# Patient Record
Sex: Female | Born: 1960 | Race: White | Hispanic: No | Marital: Married | Smoking: Never smoker
Health system: Southern US, Community
[De-identification: ages and names within clinical notes are randomized; demographics above are authoritative.]

## PROBLEM LIST (undated history)

## (undated) DIAGNOSIS — B9689 Other specified bacterial agents as the cause of diseases classified elsewhere: Secondary | ICD-10-CM

## (undated) DIAGNOSIS — K5901 Slow transit constipation: Secondary | ICD-10-CM

## (undated) DIAGNOSIS — E785 Hyperlipidemia, unspecified: Secondary | ICD-10-CM

## (undated) DIAGNOSIS — R7303 Prediabetes: Secondary | ICD-10-CM

## (undated) DIAGNOSIS — I10 Essential (primary) hypertension: Secondary | ICD-10-CM

## (undated) DIAGNOSIS — B009 Herpesviral infection, unspecified: Secondary | ICD-10-CM

## (undated) DIAGNOSIS — E039 Hypothyroidism, unspecified: Secondary | ICD-10-CM

## (undated) DIAGNOSIS — E669 Obesity, unspecified: Secondary | ICD-10-CM

## (undated) DIAGNOSIS — U071 COVID-19: Secondary | ICD-10-CM

## (undated) DIAGNOSIS — M1710 Unilateral primary osteoarthritis, unspecified knee: Secondary | ICD-10-CM

## (undated) HISTORY — PX: APPENDECTOMY: SHX54

## (undated) HISTORY — DX: COVID-19: U07.1

## (undated) HISTORY — DX: Obesity, unspecified: E66.9

## (undated) HISTORY — DX: Herpesviral infection, unspecified: B00.9

## (undated) HISTORY — DX: Hypothyroidism, unspecified: E03.9

## (undated) HISTORY — PX: TOTAL ABDOMINAL HYSTERECTOMY: SHX209

## (undated) HISTORY — DX: Hyperlipidemia, unspecified: E78.5

## (undated) HISTORY — PX: GALLBLADDER SURGERY: SHX652

## (undated) HISTORY — DX: Other specified bacterial agents as the cause of diseases classified elsewhere: B96.89

## (undated) HISTORY — DX: Prediabetes: R73.03

## (undated) HISTORY — DX: Slow transit constipation: K59.01

## (undated) HISTORY — DX: Essential (primary) hypertension: I10

## (undated) HISTORY — DX: Unilateral primary osteoarthritis, unspecified knee: M17.10

---

## 1997-09-15 ENCOUNTER — Other Ambulatory Visit: Admission: RE | Admit: 1997-09-15 | Discharge: 1997-09-15 | Payer: Self-pay | Admitting: Obstetrics & Gynecology

## 1998-10-06 ENCOUNTER — Other Ambulatory Visit: Admission: RE | Admit: 1998-10-06 | Discharge: 1998-10-06 | Payer: Self-pay | Admitting: Obstetrics & Gynecology

## 1999-03-08 ENCOUNTER — Encounter: Payer: Self-pay | Admitting: Family Medicine

## 1999-03-08 ENCOUNTER — Encounter: Admission: RE | Admit: 1999-03-08 | Discharge: 1999-03-08 | Payer: Self-pay | Admitting: Family Medicine

## 1999-05-20 ENCOUNTER — Encounter: Admission: RE | Admit: 1999-05-20 | Discharge: 1999-05-20 | Payer: Self-pay | Admitting: Family Medicine

## 1999-05-20 ENCOUNTER — Encounter: Payer: Self-pay | Admitting: Family Medicine

## 1999-06-03 ENCOUNTER — Encounter: Payer: Self-pay | Admitting: Emergency Medicine

## 1999-06-03 ENCOUNTER — Emergency Department (HOSPITAL_COMMUNITY): Admission: EM | Admit: 1999-06-03 | Discharge: 1999-06-03 | Payer: Self-pay | Admitting: Emergency Medicine

## 1999-09-15 ENCOUNTER — Encounter (INDEPENDENT_AMBULATORY_CARE_PROVIDER_SITE_OTHER): Payer: Self-pay | Admitting: Specialist

## 1999-09-15 ENCOUNTER — Inpatient Hospital Stay (HOSPITAL_COMMUNITY): Admission: RE | Admit: 1999-09-15 | Discharge: 1999-09-17 | Payer: Self-pay | Admitting: Obstetrics & Gynecology

## 2002-05-02 ENCOUNTER — Ambulatory Visit (HOSPITAL_COMMUNITY): Admission: RE | Admit: 2002-05-02 | Discharge: 2002-05-02 | Payer: Self-pay | Admitting: Endocrinology

## 2002-05-02 ENCOUNTER — Encounter: Payer: Self-pay | Admitting: Endocrinology

## 2003-12-03 ENCOUNTER — Other Ambulatory Visit: Admission: RE | Admit: 2003-12-03 | Discharge: 2003-12-03 | Payer: Self-pay | Admitting: Obstetrics & Gynecology

## 2004-05-21 ENCOUNTER — Emergency Department (HOSPITAL_COMMUNITY): Admission: EM | Admit: 2004-05-21 | Discharge: 2004-05-21 | Payer: Self-pay | Admitting: Emergency Medicine

## 2008-05-26 ENCOUNTER — Encounter: Admission: RE | Admit: 2008-05-26 | Discharge: 2008-05-26 | Payer: Self-pay | Admitting: Family Medicine

## 2008-12-27 ENCOUNTER — Encounter (INDEPENDENT_AMBULATORY_CARE_PROVIDER_SITE_OTHER): Payer: Self-pay | Admitting: General Surgery

## 2008-12-27 ENCOUNTER — Inpatient Hospital Stay (HOSPITAL_COMMUNITY): Admission: EM | Admit: 2008-12-27 | Discharge: 2008-12-29 | Payer: Self-pay | Admitting: Emergency Medicine

## 2009-02-15 ENCOUNTER — Encounter: Admission: RE | Admit: 2009-02-15 | Discharge: 2009-02-15 | Payer: Self-pay | Admitting: Family Medicine

## 2010-03-01 ENCOUNTER — Encounter
Admission: RE | Admit: 2010-03-01 | Discharge: 2010-03-01 | Payer: Self-pay | Source: Home / Self Care | Attending: Family Medicine | Admitting: Family Medicine

## 2010-05-12 ENCOUNTER — Emergency Department (HOSPITAL_COMMUNITY): Payer: 59

## 2010-05-12 ENCOUNTER — Emergency Department (HOSPITAL_COMMUNITY)
Admission: EM | Admit: 2010-05-12 | Discharge: 2010-05-12 | Disposition: A | Discharge status: Home / Self Care | Payer: 59 | Attending: Emergency Medicine | Admitting: Emergency Medicine

## 2010-05-12 DIAGNOSIS — R109 Unspecified abdominal pain: Secondary | ICD-10-CM | POA: Insufficient documentation

## 2010-05-12 DIAGNOSIS — E039 Hypothyroidism, unspecified: Secondary | ICD-10-CM | POA: Insufficient documentation

## 2010-05-12 DIAGNOSIS — N200 Calculus of kidney: Secondary | ICD-10-CM | POA: Insufficient documentation

## 2010-05-12 LAB — URINALYSIS, ROUTINE W REFLEX MICROSCOPIC

## 2010-05-12 LAB — URINE MICROSCOPIC-ADD ON

## 2010-05-13 LAB — URINE CULTURE: Culture  Setup Time: 201203090135

## 2010-06-09 LAB — DIFFERENTIAL
Basophils Absolute: 0.1 10*3/uL (ref 0.0–0.1)
Basophils Relative: 1 % (ref 0–1)
Eosinophils Absolute: 0 10*3/uL (ref 0.0–0.7)
Lymphocytes Relative: 10 % — ABNORMAL LOW (ref 12–46)

## 2010-06-09 LAB — CBC
HCT: 44.5 % (ref 36.0–46.0)
Hemoglobin: 15.3 g/dL — ABNORMAL HIGH (ref 12.0–15.0)
MCHC: 34.3 g/dL (ref 30.0–36.0)
RDW: 12.7 % (ref 11.5–15.5)
WBC: 13.9 10*3/uL — ABNORMAL HIGH (ref 4.0–10.5)

## 2010-06-09 LAB — COMPREHENSIVE METABOLIC PANEL
ALT: 33 U/L (ref 0–35)
AST: 26 U/L (ref 0–37)
CO2: 24 mEq/L (ref 19–32)
Calcium: 10.1 mg/dL (ref 8.4–10.5)
Creatinine, Ser: 0.75 mg/dL (ref 0.4–1.2)
GFR calc Af Amer: >60 mL/min (ref >60)
GFR calc non Af Amer: >60 mL/min (ref >60)
Glucose, Bld: 121 mg/dL — ABNORMAL HIGH (ref 70–99)
Sodium: 135 mEq/L (ref 135–145)

## 2010-07-22 NOTE — H&P (Signed)
[facility]  Patient:    Cynthia Hutchinson, Cynthia Hutchinson                         MRN: [id number] Attending:  Gerrit Friends. Aldona Bar, M.D.                         History and Physical  OFFICE NUMBER:  [id number].  HISTORY:  This patient is a 50 year old female, admitted for total abdominal hysterectomy and bilateral salpingo-oophorectomy, with a preoperative diagnosis of suspected adhesions, possible endometriosis, possible adenomyosis.  This patient is a gravida 5, now para 2, status post two cesarean sections, the last of which also included a tubal sterilization procedure.  She essentially has had problems with increasing discomfort, mostly in the left lower quadrant, for the past six months to a year.  She has a very significant history in that on three separate occasions, she has undergone laparoscopy for recurrent adhesions; the last time this was carried out was prior to her 1998 delivery.  As mentioned, she is status post two cesarean sections.  She has had one ectopic pregnancy and two spontaneous miscarriages.  She has also had a previous appendectomy and in the distant past, has had three procedures done on her cervix for recurrent dysplasia -- (two laser procedures and one freezing procedure).  Her current episode of discomfort essentially began in the spring of 2001.  She came to the office on several occasions with some lower back discomfort.  This was very similar in nature to the discomfort that she had prior to her previous laparoscopies, at which time, as mentioned above, adhesiolysis was carried out with improvement of her symptoms.  She actually underwent an MRI of her back, which was negative (ordered by orthopedic surgeon), and returned for followup with essentially no improvement of her back discomfort in May and again in June of 2001.  In June, she was able to relate this discomfort somewhat to her cycles and described the discomfort as burning, pulling and  tugging and the discomfort has not been relieved by any types of analgesia the patient has tried.  She was having, when seen in late June, regular cyclic menses, although prior to May, her cycles were somewhat irregular.  Because of her history of recurrent adhesions, her history of previous dysplasia of the cervix with three procedures to try to correct that problem, and the patients current symptomatology, decision was made after a long discussion with the patient to proceed with total abdominal hysterectomy and bilateral salpingo-oophorectomy. Indeed, at the time of the patients cesarean section in 1998, it was noted that she did have a large number of adhesions surrounding the right ovary. Her right fallopian tube at that time was surgically absent secondary to a previous ectopic pregnancy with surgical treatment.  As mentioned, she did undergo a tubal sterilization procedure on her left fallopian tube at the time of that cesarean section.  The patients recent cervical cytology has been within normal limits.  She did, in addition to the MRI of her back, have a pelvic ultrasound in May of 2001 with really no remarkable findings noted.  Uterus was not really enlarged and no free fluid was noted in the cul-de-sac.  A recent mammogram was likewise within normal limits.  In summary, this patient is now being admitted for total abdominal hysterectomy and bilateral salpingo-oophorectomy with estrogen-replacement therapy postoperatively, with a preoperative diagnosis of recurrent discomfort secondary to likely  recurrent adhesions, possible endometriosis, possible adenomyosis, with a distant past history of dysplasia of the cervix.  ALLERGIES:  The patient is allergic to DEMEROL.  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  PAST MEDICAL HISTORY:  The patient has had no serious  illnesses.  OPERATIONS:  As above.  FAMILY HISTORY:  Essentially noncontributory.  PAST MEDICAL HISTORY:  Negative with the exception of above.  Patient also has a diagnosis of fibromyalgia, which seems to be fairly stable at this point.  SOCIAL HISTORY:  Essentially negative.  REVIEW OF SYSTEMS:  Negative with the exception of above.  PHYSICAL EXAMINATION:  GENERAL:  Examination at the time of admission finds a well-developed female in no obvious acute distress.  VITAL SIGNS:  Weight 180-1/2 pounds.  Blood pressure 100/60.  Height 5 feet 9 inches.  Respirations 18 and regular.  Pulse 82 and regular.  Temperature 98.2.  HEENT:  Negative.  NECK:  Thyroid not enlarged.  CHEST:  Clear to auscultation and percussion.  CARDIOVASCULAR:  Regular rhythm.  No murmur.  BREASTS:  Negative.  ABDOMEN:  There is a well-healed Pfannenstiel scar.  Abdomen is essentially nontender.  Bowel sounds are normal.  PELVIC:  Examination finds the uterus normal size, anterior.  Adnexal area is unremarkable.  Rectovaginal exam confirmatory.  Cervix:  No gross lesions.  NEUROLOGIC:  Physiologic.  EXTREMITIES:  Essentially negative.  IMPRESSION:  Pelvic pain, likely secondary to recurrent adhesions -- possible endometriosis, possible adenomyosis.  PLAN:  Total abdominal hysterectomy and bilateral salpingo-oophorectomy with estrogen-replacement therapy postoperatively. DD:  09/12/99 TD:  09/13/99 Job: [id number]

## 2010-07-22 NOTE — Op Note (Signed)
[facility]  Patient:    Cynthia Hutchinson, Cynthia Hutchinson                       MRN: [id number] Proc. Date: 09/15/99 Adm. Date:  [id number] Disc. Date: [id number] Attending:  Ether Griffins, Richard Mark Iv                           Operative Report  PREOPERATIVE DIAGNOSIS:  Lower abdominal pain, suspected pelvic adhesions, possible endometriosis, possible adenomyosis, status post two cesarean sections and status post three laparoscopies.  POSTOPERATIVE DIAGNOSIS:  Lower abdominal pain, suspected pelvic adhesions, possible endometriosis, possible adenomyosis, status post two cesarean sections and status post three laparoscopies.  Pathology pending.  OPERATION PERFORMED:  Exploratory laparotomy, lysis of adhesions, total abdominal hysterectomy, bilateral salpingo-oophorectomy.  SURGEON:  Gerrit Friends. Aldona Bar, M.D.  ASSISTANT:  Luvenia Redden, M.D.  ANESTHESIA:  General endotracheal.  DESCRIPTION OF PROCEDURE:  The patient was taken to the operating room where after the satisfactory induction of general endotracheal anesthesia, she was prepped and draped and then placed in the lithotomy position.  As part of the prep a Foley catheter was placed.  Once the patient was adequately draped, the procedure was begun.  A Pfannenstiel incision was made dissecting down sharply to and through the old scar--creating hemostasis--down to the fascia which likewise was incised in a low transverse fashion.  A subfascial space was created inferiorly and superiorly, muscles separated in the midline, the peritoneum identified and entered appropriately with care taken to avoid the bowel superiorly and the bladder inferiorly.  At this time, the abdomen was explored.  There were somewhat significant adhesions between the right colon and the right adnexal area as well as between the uterus and the bladder flap.  The left adnexal area was fairly free of adhesions.  The uterus itself appeared to be  upper limits of normal size.  No upper abnormalities were noted.  At this time the self-retaining OSullivan-OConnor retractor was placed and with minimal difficulty thereafter, the bowel was packed off appropriately. At this time, lysis of adhesions was carried out freeing up the lower uterine segment from the bladder flap as well as freeing up the right adnexal area from the right colon.  At this time hysterectomy was begun in the usual fashion.  Using two long Kelly clamps, the corners of the uterus were elevated.  Beginning with the right round ligament, it was transected, rendered hemostatic with 0 Vicryl suture and the bladder flap was created anteriorly to the midline.  At this time after the right ureter was identified, the right infundibulopelvic pedicle was clamped, cut and doubly suture secured with 0 Vicryl suture, essentially removing the entire right adnexa which previously, as mentioned, had been freed up the right colon.  At this time attention was turned to the left round ligament, which, in a similar fashion to the right round ligament was transected and suture secured.  The bladder flap was created thereafter again to the midline and bladder pushed off the lower uterine segment with some adhesions encountered but essentially that portion of the procedure went well.  The left infundibulopelvic pedicle was then isolated and after the left ureter was identified, the pedicle was clamped and doubly suture secured with 0 Vicryl suture.  Attention at this time was turned to the lower uterine segment and anterior bladder flap and the bladder was pushed off the lower uterine segment with ease  and thereafter after some skeletonization of the uterine artery pedicles, the uterine artery pedicles were clamped, cut and suture secured with 0 Vicryl suture. Additional parametrial bites were taken using straight Heaney clamps and suture ligature of 0 Vicryl suture and this was carried  down to the vaginal angle which was likewise clamped, cut and suture secured on the right side and thereafter using the Satinsky scissors, the specimen essentially was removed including the cervix.  The vaginal cuff was then closed first dealing with the vaginal angles and a hemostatic suture of 0 Vicryl placed appropriately.  The vaginal cuff was then closed using figure-of-eight 0 Vicryl suture in an interrupted fashion.  At this time the pelvis was profusely irrigated and rendered hemostatic.  No additional adhesions were noted.  Once the pelvis was hemostatic, the procedure was felt to be complete.  The patient had had a previous appendectomy.  At this time packs were removed.  Retractor was removed.  Abdomen was again inspected and with all counts being correct and no foreign bodies remaining in the abdominal cavity, closure of the abdomen was begun at this time.  The abdominal peritoneum was reapproximated with 0 Vicryl in a running fashion and muscle subsequently secured with same.  Assured of good subfascial hemostasis, the fascia was then reapproximated with 0 Vicryl from angle to midline bilaterally.  Subcutaneous tissue was then rendered hemostatic and scar tissue in the subcutaneous tissue was freed up to aid in good closure of the skin which was subsequently closed with staples and thereafter a pressure dressing was applied and the patient at this time was transported to the recovery room in satisfactory condition having tolerated the procedure well.  Estimated blood loss was 250 cc.  All counts correct x 2. Specimens include the uterus, tubes and ovaries.  In summary, this patient with recurrent pelvic adhesions who was symptomatic as a result, underwent a total abdominal hysterectomy and bilateral salpingo-oophorectomy and lysis of adhesions.  She was taken to the recovery room in good condition having tolerated the procedure well.  All counts were noted to be correct x  2. DD:  09/15/99 TD:  09/15/99 Job: [id number]

## 2010-07-22 NOTE — Discharge Summary (Signed)
[facility]  Patient:    Cynthia Hutchinson, Cynthia Hutchinson                       MRN: [id number] Adm. Date:  [id number] Disc. Date: [id number] Attending:  Mickle Mallory                           Discharge Summary  DISCHARGE DIAGNOSIS:  Pelvic/abdominal adhesions - chronic pelvic pain, suspected adenomyosis and endometriosis, final pathology report pending.  PROCEDURE:  Total abdominal hysterectomy, bilateral salpingo-oophorectomy and lysis of adhesions.  SUMMARY:  This 50 year old female who has had three laparoscopies and two cesarean sections was admitted after a normal preoperative workup for a total abdominal hysterectomy and bilateral salpingo-oophorectomy with a preoperative diagnosis of suspected adhesions and possible endometriosis and adenomyosis. She underwent a TAH/BSO on July 12 without incident.  There were multiple adhesions encountered, especially involving the right ovary and ascending colon.  Procedure went well.  Her postoperative course was benign.  She remained afebrile during her entire postoperative course and her postoperative hemoglobin was 10.6 with a white count of 16,100 and a platelet count of 262,000.  On the morning of July 14, she was ambulating well, tolerating a regular diet well, having normal bowel and bladder function.  She was afebrile.  Her wound was clean and dry.  She was deemed ready for discharge. Accordingly, she was given all appropriate instructions at the time of discharge and understood all instructions well.  She will return to the office in three days for staple removal and steri-stripping of her wound.  DISCHARGE MEDICATIONS: 1. Motrin 600 mg q.6h. as needed for pain. 2. Tylox one to two q.4-6h. as needed for severe pain. 3. Vivelle dot 0.5 mg for estrogen replacement.  She was started on estrogen    replacement postoperatively.  Her pathology report is still pending at this time.  CONDITION ON DISCHARGE:   Improved. DD:  09/17/99 TD:  09/17/99 Job: [id number]

## 2012-09-18 ENCOUNTER — Other Ambulatory Visit: Payer: Self-pay | Admitting: Family Medicine

## 2012-09-18 DIAGNOSIS — M545 Low back pain: Secondary | ICD-10-CM

## 2012-09-25 ENCOUNTER — Ambulatory Visit
Admission: RE | Admit: 2012-09-25 | Discharge: 2012-09-25 | Disposition: A | Discharge status: Home / Self Care | Payer: 59 | Source: Ambulatory Visit | Attending: Family Medicine | Admitting: Family Medicine

## 2012-09-25 DIAGNOSIS — M545 Low back pain: Secondary | ICD-10-CM

## 2014-07-20 ENCOUNTER — Encounter (HOSPITAL_COMMUNITY): Payer: Self-pay | Admitting: Emergency Medicine

## 2014-07-20 ENCOUNTER — Emergency Department (HOSPITAL_COMMUNITY)
Admission: EM | Admit: 2014-07-20 | Discharge: 2014-07-20 | Disposition: A | Discharge status: Home / Self Care | Payer: 59 | Attending: Emergency Medicine | Admitting: Emergency Medicine

## 2014-07-20 ENCOUNTER — Emergency Department (HOSPITAL_COMMUNITY): Payer: 59

## 2014-07-20 DIAGNOSIS — R1032 Left lower quadrant pain: Secondary | ICD-10-CM | POA: Diagnosis present

## 2014-07-20 DIAGNOSIS — Z7982 Long term (current) use of aspirin: Secondary | ICD-10-CM | POA: Insufficient documentation

## 2014-07-20 DIAGNOSIS — Z9889 Other specified postprocedural states: Secondary | ICD-10-CM | POA: Insufficient documentation

## 2014-07-20 DIAGNOSIS — Z79899 Other long term (current) drug therapy: Secondary | ICD-10-CM | POA: Diagnosis not present

## 2014-07-20 DIAGNOSIS — N200 Calculus of kidney: Secondary | ICD-10-CM | POA: Diagnosis not present

## 2014-07-20 DIAGNOSIS — R109 Unspecified abdominal pain: Secondary | ICD-10-CM

## 2014-07-20 LAB — URINE MICROSCOPIC-ADD ON

## 2014-07-20 LAB — I-STAT CHEM 8, ED
BUN: 18 mg/dL (ref 6–20)
CALCIUM ION: 1.2 mmol/L (ref 1.12–1.23)
Chloride: 102 mmol/L (ref 101–111)
Creatinine, Ser: 0.7 mg/dL (ref 0.44–1.00)
GLUCOSE: 108 mg/dL — AB (ref 65–99)
HEMATOCRIT: 44 % (ref 36.0–46.0)
HEMOGLOBIN: 15 g/dL (ref 12.0–15.0)
POTASSIUM: 3.6 mmol/L (ref 3.5–5.1)
SODIUM: 140 mmol/L (ref 135–145)
TCO2: 22 mmol/L (ref 0–100)

## 2014-07-20 LAB — CBC
HCT: 41.8 % (ref 36.0–46.0)
HEMOGLOBIN: 13.8 g/dL (ref 12.0–15.0)
MCH: 29.9 pg (ref 26.0–34.0)
MCHC: 33 g/dL (ref 30.0–36.0)
MCV: 90.5 fL (ref 78.0–100.0)
Platelets: 265 10*3/uL (ref 150–400)
RBC: 4.62 MIL/uL (ref 3.87–5.11)
RDW: 12.9 % (ref 11.5–15.5)
WBC: 9.4 10*3/uL (ref 4.0–10.5)

## 2014-07-20 LAB — URINALYSIS, ROUTINE W REFLEX MICROSCOPIC
BILIRUBIN URINE: NEGATIVE
GLUCOSE, UA: NEGATIVE mg/dL
Ketones, ur: NEGATIVE mg/dL
Nitrite: NEGATIVE
PROTEIN: NEGATIVE mg/dL
Specific Gravity, Urine: 1.028 (ref 1.005–1.030)
Urobilinogen, UA: 0.2 mg/dL (ref 0.0–1.0)
pH: 6 (ref 5.0–8.0)

## 2014-07-20 MED ORDER — KETOROLAC TROMETHAMINE 30 MG/ML IJ SOLN
30.0000 mg | Freq: Once | INTRAMUSCULAR | Status: AC
  Administered 2014-07-20: 30 mg via INTRAVENOUS
  Filled 2014-07-20: qty 1

## 2014-07-20 MED ORDER — OXYCODONE-ACETAMINOPHEN 5-325 MG PO TABS: 1.0000 | ORAL_TABLET | ORAL | Status: DC | PRN

## 2014-07-20 MED ORDER — IBUPROFEN 800 MG PO TABS: 800.0000 mg | ORAL_TABLET | Freq: Three times a day (TID) | ORAL | Status: AC

## 2014-07-20 MED ORDER — PROMETHAZINE HCL 25 MG PO TABS: 25.0000 mg | ORAL_TABLET | Freq: Four times a day (QID) | ORAL | Status: DC | PRN

## 2014-07-20 MED ORDER — TAMSULOSIN HCL 0.4 MG PO CAPS: 0.4000 mg | ORAL_CAPSULE | Freq: Two times a day (BID) | ORAL | Status: DC

## 2014-07-20 MED ORDER — HYDROMORPHONE HCL 1 MG/ML IJ SOLN
1.0000 mg | Freq: Once | INTRAMUSCULAR | Status: AC
  Administered 2014-07-20: 1 mg via INTRAVENOUS
  Filled 2014-07-20: qty 1

## 2014-07-20 NOTE — ED Notes (Signed)
Returned from CT.

## 2014-07-20 NOTE — ED Notes (Signed)
MD at bedside. 

## 2014-07-20 NOTE — ED Provider Notes (Signed)
CSN: [id number]     Arrival date & time 07/20/14  1806 History   First MD Initiated Contact with Patient 07/20/14 1830     Chief Complaint  Patient presents with  . Flank Pain     (Consider location/radiation/quality/duration/timing/severity/associated sxs/prior Treatment) HPI Comments: The patient is a 54 year old female. She has a history of a kidney stone many years ago, she has a history of a cholecystectomy, appendectomy and a cesarean section as well as a total abdominal hysterectomy. She states she awoke this morning with left-sided flank and left lower quadrant pain, some difficulty with urination and pink colored urine. This has been persistent throughout the day of the intensity varies and fluctuates, associated nausea and diaphoresis at times, denies fevers.  Patient is a 54 y.o. female presenting with flank pain. The history is provided by the patient.  Flank Pain    History reviewed. No pertinent past medical history. Past Surgical History  Procedure Laterality Date  . Cesarean section    . Appendectomy    . Gallbladder surgery    . Total abdominal hysterectomy     No family history on file. History  Substance Use Topics  . Smoking status: Never Smoker   . Smokeless tobacco: Not on file  . Alcohol Use: Yes   OB History    No data available     Review of Systems  Genitourinary: Positive for flank pain.      Allergies  Demerol  Home Medications   Prior to Admission medications   Medication Sig Start Date End Date Taking? Authorizing Provider  acyclovir (ZOVIRAX) 400 MG tablet Take 400 mg by mouth at bedtime.  04/27/14  Yes Historical Provider, MD  aspirin 81 MG tablet Take 81 mg by mouth at bedtime.   Yes Historical Provider, MD  CRESTOR 10 MG tablet Take 10 mg by mouth 2 (two) times a week. Take on Mon & Fri. 07/09/14  Yes Historical Provider, MD  levothyroxine (SYNTHROID, LEVOTHROID) 50 MCG tablet Take 50 mcg by mouth daily before breakfast.  07/09/14  Yes  Historical Provider, MD  meloxicam (MOBIC) 15 MG tablet Take 15 mg by mouth at bedtime.  07/10/14  Yes Historical Provider, MD  vitamin E 400 UNIT capsule Take 400 Units by mouth 2 (two) times a week. Take on Mon & Fri.   Yes Historical Provider, MD  Zinc 50 MG TABS Take 1 tablet by mouth 2 (two) times a week. Take on Mon & Fri.   Yes Historical Provider, MD  ibuprofen (ADVIL,MOTRIN) 800 MG tablet Take 1 tablet (800 mg total) by mouth 3 (three) times daily. 07/20/14   Eber HongBrian Keighan Amezcua, MD  oxyCODONE-acetaminophen (PERCOCET) 5-325 MG per tablet Take 1 tablet by mouth every 4 (four) hours as needed. 07/20/14   Eber HongBrian Kashayla Ungerer, MD  promethazine (PHENERGAN) 25 MG tablet Take 1 tablet (25 mg total) by mouth every 6 (six) hours as needed for nausea or vomiting. 07/20/14   Eber HongBrian Anita Laguna, MD  tamsulosin (FLOMAX) 0.4 MG CAPS capsule Take 1 capsule (0.4 mg total) by mouth 2 (two) times daily. 07/20/14   Eber HongBrian Burch Marchuk, MD   BP 161/91 mmHg  Pulse 71  Temp(Src) 97.6 F (36.4 C) (Oral)  SpO2 100% Physical Exam  Constitutional: She appears well-developed and well-nourished. She appears distressed.  HENT:  Head: Normocephalic and atraumatic.  Mouth/Throat: Oropharynx is clear and moist. No oropharyngeal exudate.  Eyes: Conjunctivae and EOM are normal. Pupils are equal, round, and reactive to light. Right eye exhibits no  discharge. Left eye exhibits no discharge. No scleral icterus.  Neck: Normal range of motion. Neck supple. No JVD present. No thyromegaly present.  Cardiovascular: Normal rate, regular rhythm, normal heart sounds and intact distal pulses.  Exam reveals no gallop and no friction rub.   No murmur heard. Pulmonary/Chest: Effort normal and breath sounds normal. No respiratory distress. She has no wheezes. She has no rales.  Abdominal: Soft. Bowel sounds are normal. She exhibits no distension and no mass. There is tenderness (minimal left lower quadrant tenderness, left CVA tenderness is present).   Musculoskeletal: Normal range of motion. She exhibits no edema or tenderness.  Lymphadenopathy:    She has no cervical adenopathy.  Neurological: She is alert. Coordination normal.  Skin: Skin is warm and dry. No rash noted. No erythema.  Psychiatric: She has a normal mood and affect. Her behavior is normal.  Nursing note and vitals reviewed.   ED Course  Procedures (including critical care time) Labs Review Labs Reviewed  URINALYSIS, ROUTINE W REFLEX MICROSCOPIC - Abnormal; Notable for the following:    APPearance CLOUDY (*)    Hgb urine dipstick LARGE (*)    Leukocytes, UA SMALL (*)    All other components within normal limits  I-STAT CHEM 8, ED - Abnormal; Notable for the following:    Glucose, Bld 108 (*)    All other components within normal limits  CBC  URINE MICROSCOPIC-ADD ON    Imaging Review Ct Abdomen Pelvis Wo Contrast  07/20/2014   CLINICAL DATA:  54 year old female is with left flank pain, nausea, vomiting and diarrhea.  EXAM: CT ABDOMEN AND PELVIS WITHOUT CONTRAST  TECHNIQUE: Multidetector CT imaging of the abdomen and pelvis was performed following the standard protocol without IV contrast.  COMPARISON:  Prior CT abdomen/ pelvis 05/12/2010  FINDINGS: Lower Chest: Right middle lobe pulmonary nodule stable dating back to December of 2010 and therefore benign. Mild dependent atelectasis in the lower lobes. Visualized cardiac structures within normal limits for size. No pericardial effusion. Unremarkable visualized thoracic esophagus.  Abdomen: Unenhanced CT was performed per clinician order. Lack of IV contrast limits sensitivity and specificity, especially for evaluation of abdominal/pelvic solid viscera. Within these limitations, unremarkable CT appearance of the stomach, duodenum, spleen, adrenal glands and pancreas. New normal hepatic contour and morphology. No discrete hepatic lesion. The gallbladder is surgically absent. No evidence of biliary ductal dilatation.  Small  2 mm partially obstructing stone at the left ureterovesicular junction. There is mild hydroureteronephrosis and renal edema but no significant perinephric stranding. No additional nephrolithiasis identified in either kidney.  No focal bowel wall thickening or evidence of obstruction. No free fluid or suspicious adenopathy.  Pelvis: Surgical changes of prior hysterectomy. The bladder is unremarkable.  Bones/Soft Tissues: No acute fracture or aggressive appearing lytic or blastic osseous lesion. Degenerative disc disease at L4-L5 and L5-S1.  Vascular: Limited evaluation in the absence of intravenous contrast. Minimal atherosclerotic vascular calcifications.  IMPRESSION: 1. Partially obstructing 2 mm stone at the left UVJ with associated mild hydronephrosis and renal edema. 2. Additional ancillary findings as above without significant interval change.   Electronically Signed   By: Malachy MoanHeath  McCullough M.D.   On: 07/20/2014 20:19     EKG Interpretation None      MDM   Final diagnoses:  Flank pain  Kidney stone on left side    The patient has vital signs with mild hypertension, she is otherwise consistent with a kidney stone as is evidenced by a bedside ultrasound  showing mild hydronephrosis on the left. CT scan ordered to evaluate size of the kidney stone, urinalysis, kidney function. Pain medications as below.  Emergency Focused Ultrasound Exam Limited retroperitoneal ultrasound of kidneys  Performed and interpreted by Dr. Hyacinth Meeker Indication: flank pain Focused abdominal ultrasound with both kidneys imaged in transverse and longitudinal planes in real-time. Interpretation: Left sided hydronephrosis visualized.   Images archived electronically   CT confirms distal stone Renal function preserved UA without infection Pt informed Given copy of results, Stable for d/c.  Meds given in ED:  Medications  ketorolac (TORADOL) 30 MG/ML injection 30 mg (30 mg Intravenous Given 07/20/14 1929)   HYDROmorphone (DILAUDID) injection 1 mg (1 mg Intravenous Given 07/20/14 1930)    New Prescriptions   IBUPROFEN (ADVIL,MOTRIN) 800 MG TABLET    Take 1 tablet (800 mg total) by mouth 3 (three) times daily.   OXYCODONE-ACETAMINOPHEN (PERCOCET) 5-325 MG PER TABLET    Take 1 tablet by mouth every 4 (four) hours as needed.   PROMETHAZINE (PHENERGAN) 25 MG TABLET    Take 1 tablet (25 mg total) by mouth every 6 (six) hours as needed for nausea or vomiting.   TAMSULOSIN (FLOMAX) 0.4 MG CAPS CAPSULE    Take 1 capsule (0.4 mg total) by mouth 2 (two) times daily.        Eber Hong, MD 07/20/14 2034

## 2014-07-20 NOTE — ED Notes (Signed)
Pt states that she feels as if she is having a kidney stone. L flank pain, scant urine output. Alert and oriented. Hx of same 6 years ago.

## 2014-07-20 NOTE — ED Notes (Signed)
Bed: WA17 Expected date:  Expected time:  Means of arrival:  Comments: 

## 2016-04-26 DIAGNOSIS — E039 Hypothyroidism, unspecified: Secondary | ICD-10-CM | POA: Diagnosis not present

## 2016-04-26 DIAGNOSIS — E782 Mixed hyperlipidemia: Secondary | ICD-10-CM | POA: Diagnosis not present

## 2016-04-28 DIAGNOSIS — E782 Mixed hyperlipidemia: Secondary | ICD-10-CM | POA: Diagnosis not present

## 2016-04-28 DIAGNOSIS — R7301 Impaired fasting glucose: Secondary | ICD-10-CM | POA: Diagnosis not present

## 2016-04-28 DIAGNOSIS — E039 Hypothyroidism, unspecified: Secondary | ICD-10-CM | POA: Diagnosis not present

## 2016-06-01 DIAGNOSIS — B338 Other specified viral diseases: Secondary | ICD-10-CM | POA: Diagnosis not present

## 2016-06-01 DIAGNOSIS — J028 Acute pharyngitis due to other specified organisms: Secondary | ICD-10-CM | POA: Diagnosis not present

## 2016-09-18 DIAGNOSIS — M25562 Pain in left knee: Secondary | ICD-10-CM | POA: Diagnosis not present

## 2016-09-28 DIAGNOSIS — M19071 Primary osteoarthritis, right ankle and foot: Secondary | ICD-10-CM | POA: Diagnosis not present

## 2016-09-28 DIAGNOSIS — M19072 Primary osteoarthritis, left ankle and foot: Secondary | ICD-10-CM | POA: Diagnosis not present

## 2016-10-23 DIAGNOSIS — E039 Hypothyroidism, unspecified: Secondary | ICD-10-CM | POA: Diagnosis not present

## 2016-10-23 DIAGNOSIS — E782 Mixed hyperlipidemia: Secondary | ICD-10-CM | POA: Diagnosis not present

## 2016-10-27 DIAGNOSIS — E039 Hypothyroidism, unspecified: Secondary | ICD-10-CM | POA: Diagnosis not present

## 2016-10-27 DIAGNOSIS — M1712 Unilateral primary osteoarthritis, left knee: Secondary | ICD-10-CM | POA: Diagnosis not present

## 2016-10-27 DIAGNOSIS — E782 Mixed hyperlipidemia: Secondary | ICD-10-CM | POA: Diagnosis not present

## 2016-11-14 DIAGNOSIS — Z01419 Encounter for gynecological examination (general) (routine) without abnormal findings: Secondary | ICD-10-CM | POA: Diagnosis not present

## 2016-11-14 DIAGNOSIS — Z1231 Encounter for screening mammogram for malignant neoplasm of breast: Secondary | ICD-10-CM | POA: Diagnosis not present

## 2016-11-27 DIAGNOSIS — M1712 Unilateral primary osteoarthritis, left knee: Secondary | ICD-10-CM | POA: Diagnosis not present

## 2016-12-04 DIAGNOSIS — M1712 Unilateral primary osteoarthritis, left knee: Secondary | ICD-10-CM | POA: Diagnosis not present

## 2016-12-08 DIAGNOSIS — Z1211 Encounter for screening for malignant neoplasm of colon: Secondary | ICD-10-CM | POA: Diagnosis not present

## 2016-12-08 DIAGNOSIS — Z8371 Family history of colonic polyps: Secondary | ICD-10-CM | POA: Diagnosis not present

## 2016-12-08 DIAGNOSIS — K5730 Diverticulosis of large intestine without perforation or abscess without bleeding: Secondary | ICD-10-CM | POA: Diagnosis not present

## 2016-12-11 DIAGNOSIS — Z23 Encounter for immunization: Secondary | ICD-10-CM | POA: Diagnosis not present

## 2016-12-20 DIAGNOSIS — M1711 Unilateral primary osteoarthritis, right knee: Secondary | ICD-10-CM | POA: Diagnosis not present

## 2016-12-20 DIAGNOSIS — M94261 Chondromalacia, right knee: Secondary | ICD-10-CM | POA: Diagnosis not present

## 2016-12-20 DIAGNOSIS — M25562 Pain in left knee: Secondary | ICD-10-CM | POA: Diagnosis not present

## 2016-12-20 DIAGNOSIS — M1712 Unilateral primary osteoarthritis, left knee: Secondary | ICD-10-CM | POA: Diagnosis not present

## 2017-02-22 DIAGNOSIS — G8918 Other acute postprocedural pain: Secondary | ICD-10-CM | POA: Diagnosis not present

## 2017-02-22 DIAGNOSIS — M1712 Unilateral primary osteoarthritis, left knee: Secondary | ICD-10-CM | POA: Diagnosis not present

## 2017-03-02 DIAGNOSIS — G8918 Other acute postprocedural pain: Secondary | ICD-10-CM | POA: Diagnosis not present

## 2017-03-02 DIAGNOSIS — M25562 Pain in left knee: Secondary | ICD-10-CM | POA: Diagnosis not present

## 2017-03-02 DIAGNOSIS — L249 Irritant contact dermatitis, unspecified cause: Secondary | ICD-10-CM | POA: Diagnosis not present

## 2017-03-02 DIAGNOSIS — K5901 Slow transit constipation: Secondary | ICD-10-CM | POA: Diagnosis not present

## 2017-03-02 DIAGNOSIS — R399 Unspecified symptoms and signs involving the genitourinary system: Secondary | ICD-10-CM | POA: Diagnosis not present

## 2017-03-05 DIAGNOSIS — M25562 Pain in left knee: Secondary | ICD-10-CM | POA: Diagnosis not present

## 2017-03-05 DIAGNOSIS — G8918 Other acute postprocedural pain: Secondary | ICD-10-CM | POA: Diagnosis not present

## 2017-03-09 DIAGNOSIS — M25562 Pain in left knee: Secondary | ICD-10-CM | POA: Diagnosis not present

## 2017-03-09 DIAGNOSIS — G8918 Other acute postprocedural pain: Secondary | ICD-10-CM | POA: Diagnosis not present

## 2017-03-12 DIAGNOSIS — M25562 Pain in left knee: Secondary | ICD-10-CM | POA: Diagnosis not present

## 2017-03-12 DIAGNOSIS — G8918 Other acute postprocedural pain: Secondary | ICD-10-CM | POA: Diagnosis not present

## 2017-03-15 DIAGNOSIS — M25562 Pain in left knee: Secondary | ICD-10-CM | POA: Diagnosis not present

## 2017-03-15 DIAGNOSIS — G8918 Other acute postprocedural pain: Secondary | ICD-10-CM | POA: Diagnosis not present

## 2017-03-19 DIAGNOSIS — G8918 Other acute postprocedural pain: Secondary | ICD-10-CM | POA: Diagnosis not present

## 2017-03-22 DIAGNOSIS — M25562 Pain in left knee: Secondary | ICD-10-CM | POA: Diagnosis not present

## 2017-03-22 DIAGNOSIS — G8918 Other acute postprocedural pain: Secondary | ICD-10-CM | POA: Diagnosis not present

## 2017-03-28 DIAGNOSIS — M25562 Pain in left knee: Secondary | ICD-10-CM | POA: Diagnosis not present

## 2017-03-28 DIAGNOSIS — G8918 Other acute postprocedural pain: Secondary | ICD-10-CM | POA: Diagnosis not present

## 2017-03-29 DIAGNOSIS — R319 Hematuria, unspecified: Secondary | ICD-10-CM | POA: Diagnosis not present

## 2017-04-02 DIAGNOSIS — G8918 Other acute postprocedural pain: Secondary | ICD-10-CM | POA: Diagnosis not present

## 2017-04-06 DIAGNOSIS — Z96652 Presence of left artificial knee joint: Secondary | ICD-10-CM | POA: Diagnosis not present

## 2017-04-06 DIAGNOSIS — Z471 Aftercare following joint replacement surgery: Secondary | ICD-10-CM | POA: Diagnosis not present

## 2017-04-24 DIAGNOSIS — E039 Hypothyroidism, unspecified: Secondary | ICD-10-CM | POA: Diagnosis not present

## 2017-04-30 DIAGNOSIS — E782 Mixed hyperlipidemia: Secondary | ICD-10-CM | POA: Diagnosis not present

## 2017-04-30 DIAGNOSIS — E039 Hypothyroidism, unspecified: Secondary | ICD-10-CM | POA: Diagnosis not present

## 2017-04-30 DIAGNOSIS — R7301 Impaired fasting glucose: Secondary | ICD-10-CM | POA: Diagnosis not present

## 2017-06-07 DIAGNOSIS — I10 Essential (primary) hypertension: Secondary | ICD-10-CM | POA: Diagnosis not present

## 2017-07-04 DIAGNOSIS — M19071 Primary osteoarthritis, right ankle and foot: Secondary | ICD-10-CM | POA: Diagnosis not present

## 2017-07-04 DIAGNOSIS — M19072 Primary osteoarthritis, left ankle and foot: Secondary | ICD-10-CM | POA: Diagnosis not present

## 2017-07-04 DIAGNOSIS — M79672 Pain in left foot: Secondary | ICD-10-CM | POA: Diagnosis not present

## 2017-07-04 DIAGNOSIS — M79671 Pain in right foot: Secondary | ICD-10-CM | POA: Diagnosis not present

## 2017-10-29 DIAGNOSIS — E039 Hypothyroidism, unspecified: Secondary | ICD-10-CM | POA: Diagnosis not present

## 2017-10-29 DIAGNOSIS — E782 Mixed hyperlipidemia: Secondary | ICD-10-CM | POA: Diagnosis not present

## 2017-11-01 DIAGNOSIS — E782 Mixed hyperlipidemia: Secondary | ICD-10-CM | POA: Diagnosis not present

## 2017-11-01 DIAGNOSIS — Z713 Dietary counseling and surveillance: Secondary | ICD-10-CM | POA: Diagnosis not present

## 2017-11-01 DIAGNOSIS — E039 Hypothyroidism, unspecified: Secondary | ICD-10-CM | POA: Diagnosis not present

## 2017-12-03 DIAGNOSIS — Z1231 Encounter for screening mammogram for malignant neoplasm of breast: Secondary | ICD-10-CM | POA: Diagnosis not present

## 2017-12-03 DIAGNOSIS — Z01419 Encounter for gynecological examination (general) (routine) without abnormal findings: Secondary | ICD-10-CM | POA: Diagnosis not present

## 2017-12-08 DIAGNOSIS — Z23 Encounter for immunization: Secondary | ICD-10-CM | POA: Diagnosis not present

## 2018-01-03 DIAGNOSIS — M25561 Pain in right knee: Secondary | ICD-10-CM | POA: Diagnosis not present

## 2018-01-03 DIAGNOSIS — M25562 Pain in left knee: Secondary | ICD-10-CM | POA: Diagnosis not present

## 2018-01-22 DIAGNOSIS — M25562 Pain in left knee: Secondary | ICD-10-CM | POA: Diagnosis not present

## 2018-05-07 DIAGNOSIS — R7301 Impaired fasting glucose: Secondary | ICD-10-CM | POA: Diagnosis not present

## 2018-05-07 DIAGNOSIS — R0981 Nasal congestion: Secondary | ICD-10-CM | POA: Diagnosis not present

## 2018-05-07 DIAGNOSIS — E039 Hypothyroidism, unspecified: Secondary | ICD-10-CM | POA: Diagnosis not present

## 2018-05-09 DIAGNOSIS — R7303 Prediabetes: Secondary | ICD-10-CM | POA: Diagnosis not present

## 2018-05-09 DIAGNOSIS — I10 Essential (primary) hypertension: Secondary | ICD-10-CM | POA: Diagnosis not present

## 2018-05-09 DIAGNOSIS — E782 Mixed hyperlipidemia: Secondary | ICD-10-CM | POA: Diagnosis not present

## 2018-07-15 DIAGNOSIS — M238X1 Other internal derangements of right knee: Secondary | ICD-10-CM | POA: Diagnosis not present

## 2018-07-15 DIAGNOSIS — M1711 Unilateral primary osteoarthritis, right knee: Secondary | ICD-10-CM | POA: Diagnosis not present

## 2018-07-15 DIAGNOSIS — M25561 Pain in right knee: Secondary | ICD-10-CM | POA: Diagnosis not present

## 2018-07-15 DIAGNOSIS — Z96652 Presence of left artificial knee joint: Secondary | ICD-10-CM | POA: Diagnosis not present

## 2018-12-24 ENCOUNTER — Other Ambulatory Visit: Payer: Self-pay

## 2018-12-24 DIAGNOSIS — Z20822 Contact with and (suspected) exposure to covid-19: Secondary | ICD-10-CM

## 2018-12-26 LAB — NOVEL CORONAVIRUS, NAA: SARS-CoV-2, NAA: NOT DETECTED

## 2021-02-14 ENCOUNTER — Other Ambulatory Visit: Payer: Self-pay | Admitting: Family Medicine

## 2021-02-14 DIAGNOSIS — Z1231 Encounter for screening mammogram for malignant neoplasm of breast: Secondary | ICD-10-CM

## 2021-03-04 ENCOUNTER — Ambulatory Visit
Admission: RE | Admit: 2021-03-04 | Discharge: 2021-03-04 | Disposition: A | Discharge status: Home / Self Care | Payer: 59 | Source: Ambulatory Visit | Attending: Family Medicine | Admitting: Family Medicine

## 2021-03-04 ENCOUNTER — Other Ambulatory Visit: Payer: Self-pay

## 2021-03-04 DIAGNOSIS — Z1231 Encounter for screening mammogram for malignant neoplasm of breast: Secondary | ICD-10-CM

## 2021-03-15 ENCOUNTER — Ambulatory Visit: Payer: 59

## 2022-03-26 IMAGING — MG MM DIGITAL SCREENING BILAT W/ TOMO AND CAD
8 series · 8 of 24 positions shown · non-contrast
Comparison: Previous exam(s).

CLINICAL DATA: Screening.

EXAM:
DIGITAL SCREENING BILATERAL MAMMOGRAM WITH TOMOSYNTHESIS AND CAD
TECHNIQUE: Bilateral screening digital craniocaudal and mediolateral oblique
mammograms were obtained. Bilateral screening digital breast
tomosynthesis was performed. The images were evaluated with
computer-aided detection.

[L MLO synth-2D]
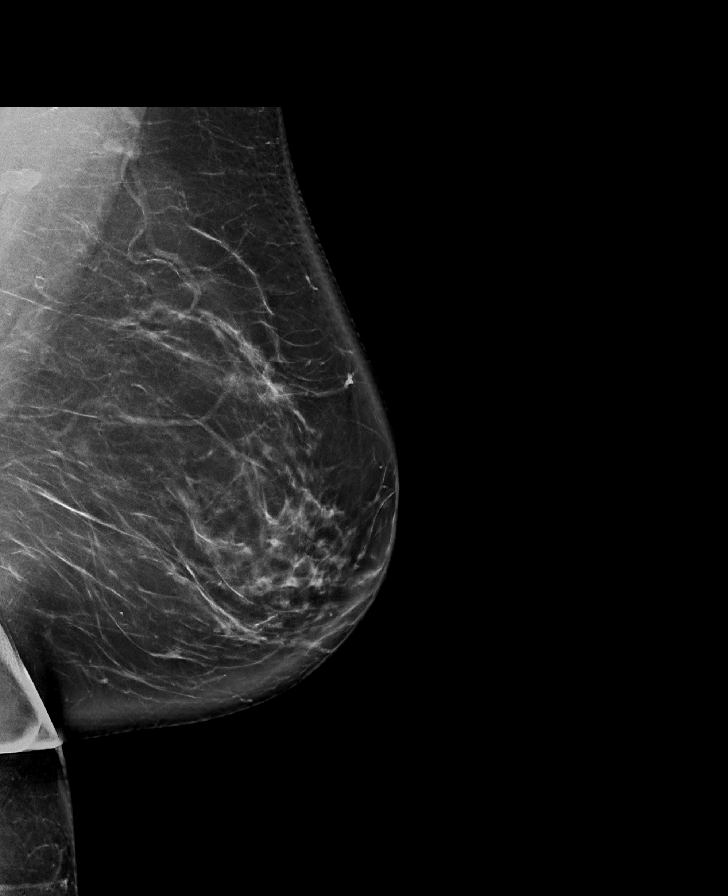

[R MLO synth-2D]
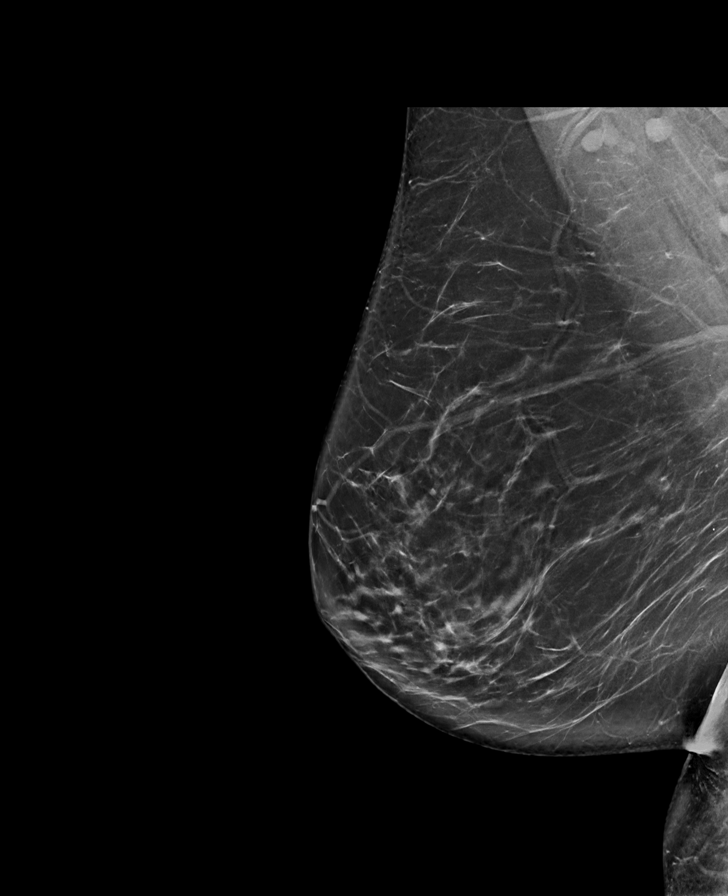

[R CC synth-2D]
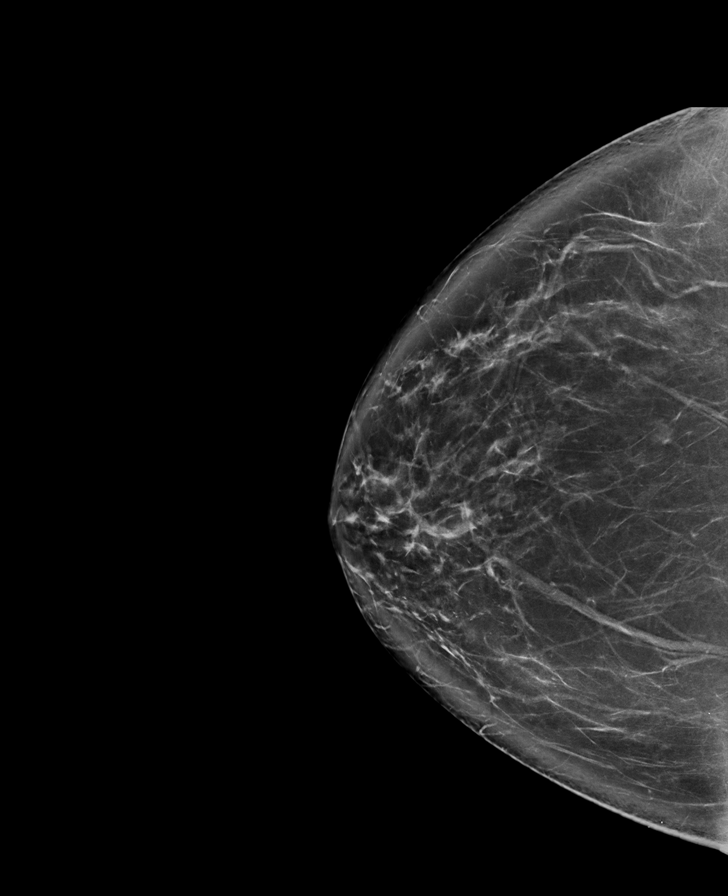

[L CC synth-2D]
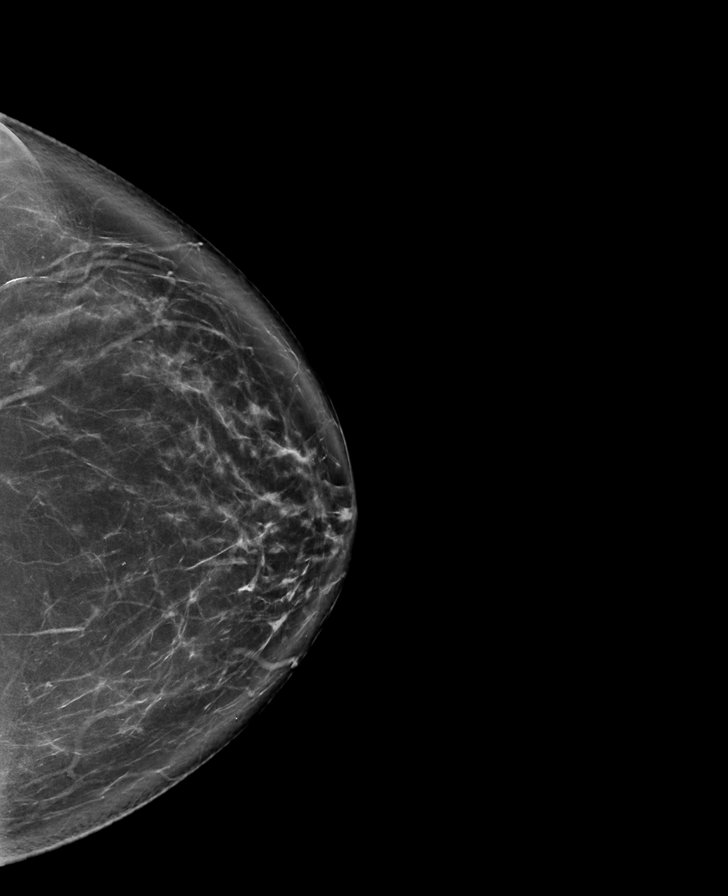

[R MLO tomo · tomo slice 49/98.0]
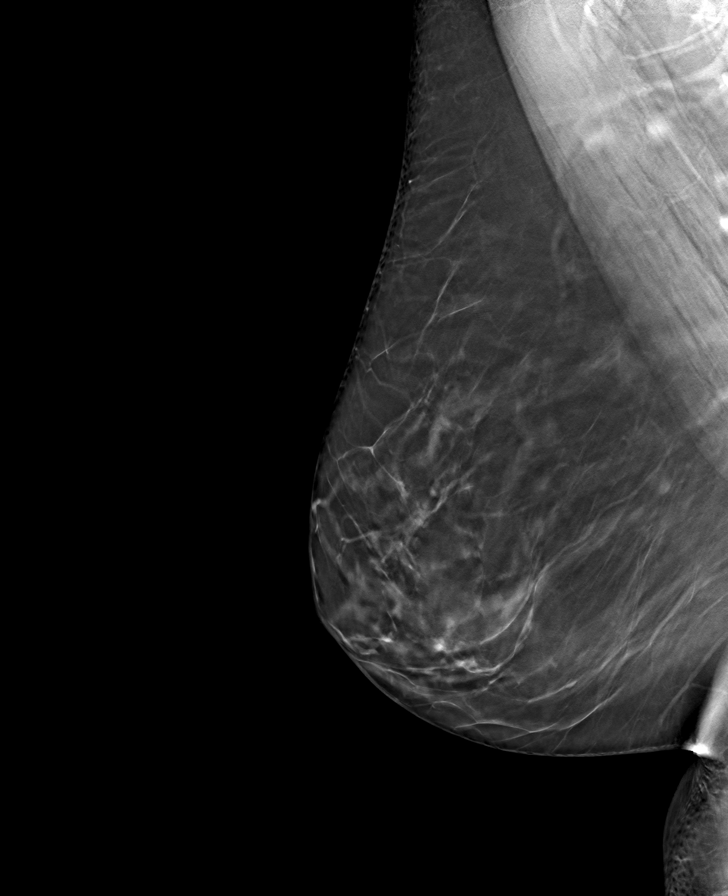

[R CC tomo · tomo slice 47/92.0]
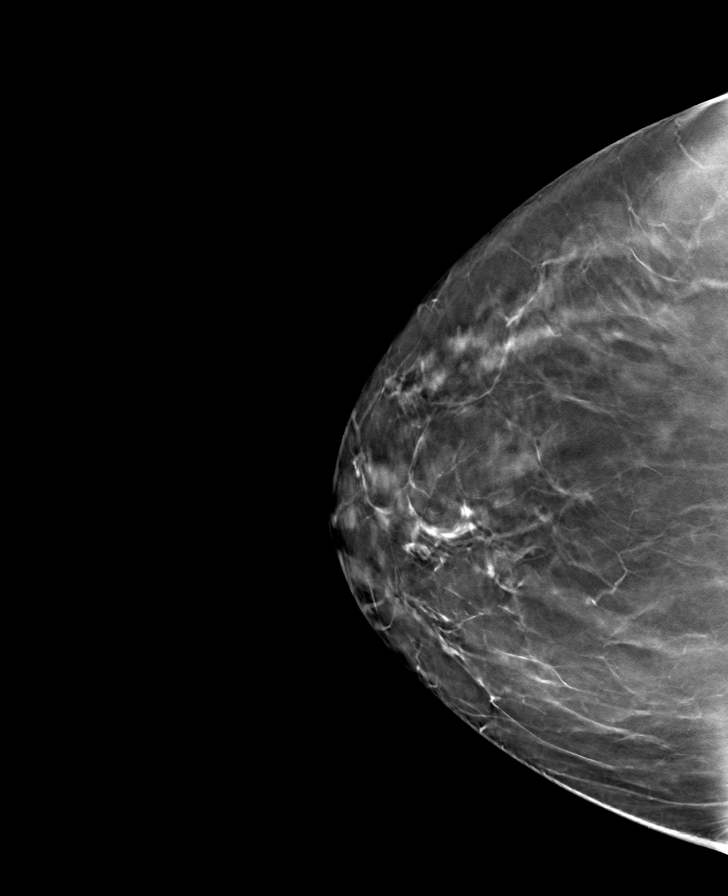

[L MLO tomo · tomo slice 51/101.0]
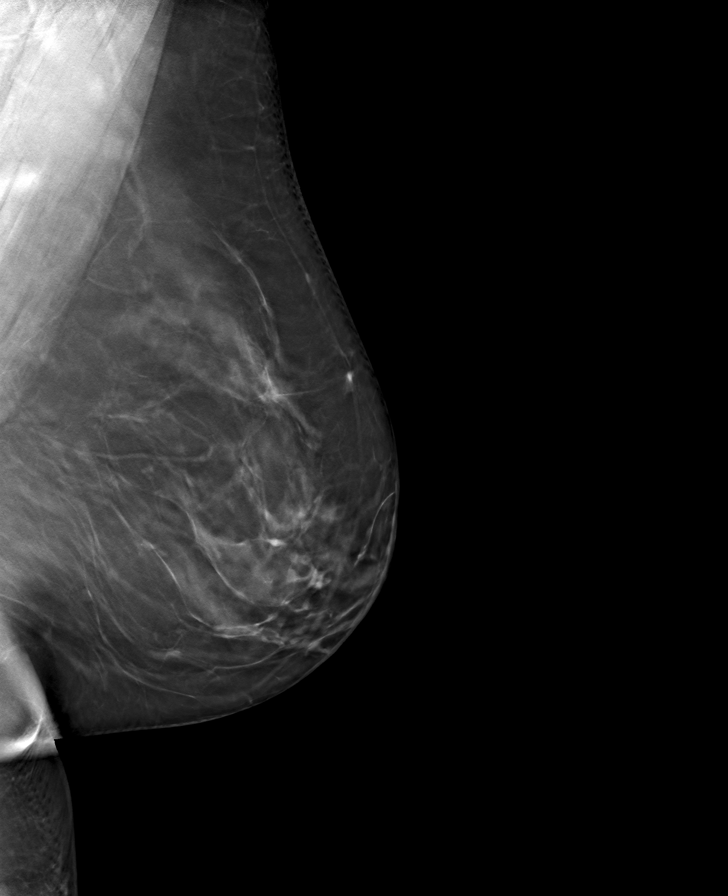

[L CC tomo · tomo slice 49/96.0]
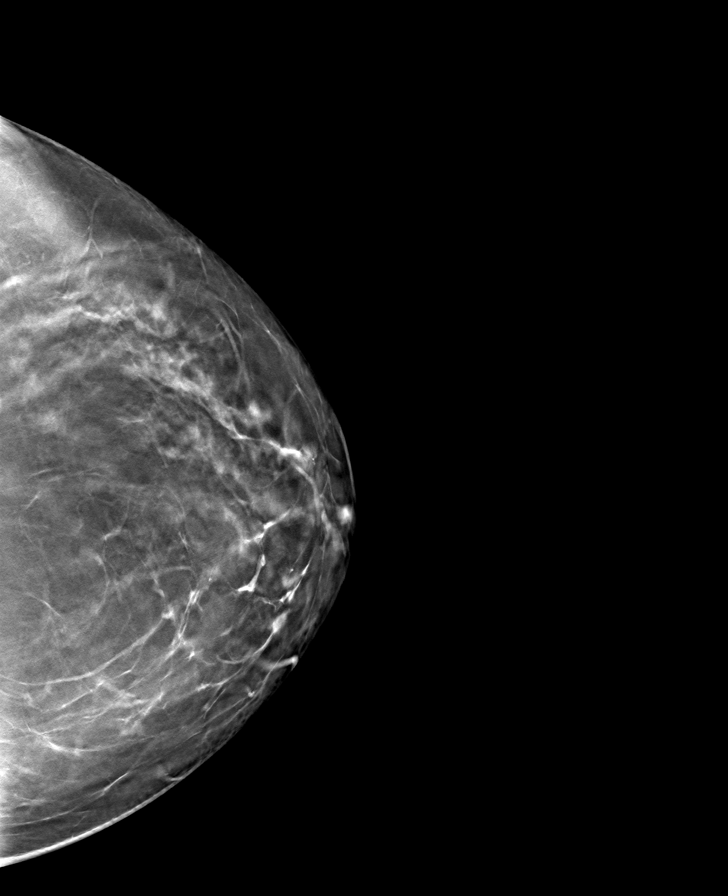

[8 of 24 positions shown; findings below may reference images not displayed]

ACR Breast Density Category b: There are scattered areas of
fibroglandular density.
FINDINGS: There are no findings suspicious for malignancy.
IMPRESSION: No mammographic evidence of malignancy. A result letter of this
screening mammogram will be mailed directly to the patient.

RECOMMENDATION:
Screening mammogram in one year. (Code:51-O-LD2)

BI-RADS CATEGORY  1: Negative.

## 2022-11-08 ENCOUNTER — Other Ambulatory Visit (HOSPITAL_BASED_OUTPATIENT_CLINIC_OR_DEPARTMENT_OTHER): Payer: Self-pay | Admitting: Family Medicine

## 2022-11-08 DIAGNOSIS — I2510 Atherosclerotic heart disease of native coronary artery without angina pectoris: Secondary | ICD-10-CM

## 2022-12-05 ENCOUNTER — Ambulatory Visit (HOSPITAL_BASED_OUTPATIENT_CLINIC_OR_DEPARTMENT_OTHER)
Admission: RE | Admit: 2022-12-05 | Discharge: 2022-12-05 | Disposition: A | Discharge status: Home / Self Care | Payer: 59 | Source: Ambulatory Visit | Attending: Family Medicine | Admitting: Family Medicine

## 2022-12-05 DIAGNOSIS — I2510 Atherosclerotic heart disease of native coronary artery without angina pectoris: Secondary | ICD-10-CM

## 2023-02-13 ENCOUNTER — Encounter: Payer: Self-pay | Admitting: Cardiology

## 2023-02-13 ENCOUNTER — Ambulatory Visit: Payer: 59 | Attending: Cardiology | Admitting: Cardiology

## 2023-02-13 VITALS — BP 136/74 | HR 69 | Resp 16 | Ht 69.0 in | Wt 206.6 lb

## 2023-02-13 DIAGNOSIS — I10 Essential (primary) hypertension: Secondary | ICD-10-CM

## 2023-02-13 DIAGNOSIS — E782 Mixed hyperlipidemia: Secondary | ICD-10-CM

## 2023-02-13 DIAGNOSIS — R931 Abnormal findings on diagnostic imaging of heart and coronary circulation: Secondary | ICD-10-CM | POA: Diagnosis not present

## 2023-02-13 DIAGNOSIS — Z8249 Family history of ischemic heart disease and other diseases of the circulatory system: Secondary | ICD-10-CM | POA: Diagnosis not present

## 2023-02-13 MED ORDER — LOSARTAN POTASSIUM-HCTZ 50-12.5 MG PO TABS: 1.0000 | ORAL_TABLET | ORAL | 0 refills | Status: DC

## 2023-02-13 NOTE — Progress Notes (Signed)
Cardiology Office Note:  .   Date:  02/13/2023  ID:  Cynthia Hutchinson, DOB [birth year], MRN [id number] PCP: Cynthia Dimitri, MD  [facility] Providers Cardiologist:  Yates Decamp, MD   History of Present Illness: .   Cynthia Hutchinson is a 62 y.o. Female patient with prediabetes, primary hypertension, mixed hypercholesterolemia, history of elevated coronary calcium score in the 96 percentile in 2020 and a strong family history of premature coronary disease referred to me for cardiac risk stratification and statin intolerance.  Discussed the use of AI scribe software for clinical note transcription with the patient, who gave verbal consent to proceed.  History of Present Illness   Cynthia Hutchinson, a 62 year old with a history of prediabetes, high cholesterol, and high blood pressure, presents for evaluation of incidental coronary artery calcification found on a CT scan during a kidney stone episode. She reports no symptoms of chest pain, shortness of breath, or other cardiac-related issues. She maintains an active lifestyle, including daily walks with her dog and yard work. However, she struggles with weight management despite her physical activity and efforts to eat healthily.  Cynthia Hutchinson has a family history of heart disease, with her father experiencing heart issues in his 39s and her mother in her late 98s. Her older brother, currently 88, had significant blockage last year. She is a former smoker, having quit approximately 36 years ago after smoking a pack and a half a day for about 2-3 years.  She is currently on aspirin for coronary artery disease, Synthroid for hypothyroidism, and Crestor for high cholesterol. Her primary care physician recently increased her Crestor dosage from 20mg  to 40mg  to further lower her LDL cholesterol level.      Review of Systems  Cardiovascular:  Negative for chest pain, dyspnea on exertion and leg swelling.   Labs   External Labs:  Labs 12/07/2022:  A1c  6.1%.  TSH normal at 1.00.  Total cholesterol 164, triglycerides 194, HDL 50, LDL 81.  Serum glucose 110 mg, BUN 11, creatinine 0.69, EGFR 98 mL, potassium 4.7, LFTs normal.  Physical Exam:   VS:  BP 136/74 (BP Location: Left Arm, Patient Position: Sitting, Cuff Size: Normal)   Pulse 69   Resp 16   Ht 5\' 9"  (1.753 m)   Wt 206 lb 9.6 oz (93.7 kg)   SpO2 96%   BMI 30.51 kg/m    Wt Readings from Last 3 Encounters:  02/13/23 206 lb 9.6 oz (93.7 kg)     Physical Exam Constitutional:      Appearance: She is obese.  Neck:     Vascular: No carotid bruit or JVD.  Cardiovascular:     Rate and Rhythm: Normal rate and regular rhythm.     Pulses: Intact distal pulses.     Heart sounds: Normal heart sounds. No murmur heard.    No gallop.  Pulmonary:     Effort: Pulmonary effort is normal.     Breath sounds: Normal breath sounds.  Abdominal:     General: Bowel sounds are normal.     Palpations: Abdomen is soft.  Musculoskeletal:     Right lower leg: No edema.     Left lower leg: No edema.    Studies Reviewed: Marland Kitchen    Coronary calcium score 12/05/2022 Total Agatston coronary calcium score 325. MESA database percentile 96. LM: 0 LAD: 21 LCx: 153 RCA: 152 Visualized ascending and descending aorta normal caliber, no abnormality. Extracardiac abnormalities: Stable pulmonary nodule right middle lobe .  EKG:    EKG 02/13/2023: Normal sinus rhythm at rate of 67 bpm, left anterior fascicular block.  Incomplete right bundle branch block.  No evidence of ischemia.  Medications and allergies    Allergies  Allergen Reactions   Demerol [Meperidine]      Current Outpatient Medications:    acyclovir (ZOVIRAX) 400 MG tablet, Take 400 mg by mouth at bedtime. , Disp: , Rfl:    aspirin 81 MG tablet, Take 81 mg by mouth at bedtime., Disp: , Rfl:    Cholecalciferol 50 MCG (2000 UT) TABS, 1 tablet Orally Once a day, Disp: , Rfl:    Coenzyme Q10 (Q-SORB CO Q-10) 100 MG capsule, Take 100 mg by  mouth daily., Disp: , Rfl:    CRESTOR 10 MG tablet, Take 10 mg by mouth 2 (two) times a week. Take on Mon & Fri., Disp: , Rfl:    ibuprofen (ADVIL,MOTRIN) 800 MG tablet, Take 1 tablet (800 mg total) by mouth 3 (three) times daily., Disp: 21 tablet, Rfl: 0   levothyroxine (SYNTHROID, LEVOTHROID) 50 MCG tablet, Take 50 mcg by mouth daily before breakfast. , Disp: , Rfl: 1   losartan-hydrochlorothiazide (HYZAAR) 50-12.5 MG tablet, Take 1 tablet by mouth every morning., Disp: 90 tablet, Rfl: 0   Multiple Vitamin (MULTIVITAMIN ADULT) TABS, 1 tablet Orally Once a day, Disp: , Rfl:    vitamin E 400 UNIT capsule, Take 400 Units by mouth 2 (two) times a week. Take on Mon & Fri., Disp: , Rfl:    Zinc 50 MG TABS, Take 1 tablet by mouth 2 (two) times a week. Take on Mon & Fri., Disp: , Rfl:    ASSESSMENT AND PLAN: .      ICD-10-CM   1. Elevated Coronary calcium score obtained 12/05/2022: Total 325 (96th percentile and [employer]).  R93.1 EKG 12-Lead    2. Mixed hypercholesterolemia and hypertriglyceridemia  E78.2     3. Primary hypertension  I10 losartan-hydrochlorothiazide (HYZAAR) 50-12.5 MG tablet    4. Family history of premature CAD  Z82.49      Assessment and Plan    Coronary Artery Disease High coronary calcium score (96th percentile) with a family history of heart disease. Currently on aspirin and Crestor 40mg  for cholesterol management. LDL at 81, goal is to reduce to 70. Discussed the importance of lifestyle modifications including diet and physical activity. -Continue aspirin and Crestor 40mg  daily. -Consider water-based exercises to increase metabolism and aid in weight loss. -Reduce caloric intake by approximately 20%. -Recheck cholesterol in a few months with PCP. -As patient is fairly active, her metabolic equivalent is 7-8 METS or greater, no stress testing is indicated especially in the absence of symptoms.  Normal physical exam, EKG suggestive of hypertensive heart disease with  LAFB.  Hypertension Elevated blood pressure noted during visit. Currently on hydrochlorothiazide. Discussed the addition of losartan for blood pressure control and heart health including elevated coronary calcium score, primary hypertension, family history of?  Premature coronary disease and prediabetes mellitus, ACE inhibitor or ARB would be appropriate. -Discontinue hydrochlorothiazide. -Start losartan, to be taken in the morning. -Adjust medication as necessary with weight loss.  Prediabetes A1C at 6.1, recent weight loss noted. Discussed the importance of continued weight loss and dietary modifications. -Continue current management strategies. -Consider discussing weight loss medications with PCP.  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 Active despite arthritis. -Continue current activity level as tolerated.  Obesity Discussed the importance of weight loss for overall health  and management of current conditions. Noted waist size over 35 inches. -Continue physical activity, consider water-based exercises. -Reduce caloric intake by approximately 20%. -Consider discussing weight loss medications with PCP.  Follow-up as needed for any symptoms of chest pain or shortness of breath.   Signed,  Yates Decamp, MD, [facility] 02/13/2023, 8:47 PM [facility] [address] [place], [place] [address] Phone: [phone]. Fax:  [phone]

## 2023-02-13 NOTE — Patient Instructions (Signed)
Medication Instructions:  Your physician has recommended you make the following change in your medication:  Stop hydrochlorothiazide Start losartan/hydrochlorothiazide 50/12.5 mg by mouth daily   *If you need a refill on your cardiac medications before your next appointment, please call your pharmacy*   Lab Work: none If you have labs (blood work) drawn today and your tests are completely normal, you will receive your results only by: MyChart Message (if you have MyChart) OR A paper copy in the mail If you have any lab test that is abnormal or we need to change your treatment, we will call you to review the results.   Testing/Procedures: none   Follow-Up: At [facility], you and your health needs are our priority.  As part of our continuing mission to provide you with exceptional heart care, we have created designated Provider Care Teams.  These Care Teams include your primary Cardiologist (physician) and Advanced Practice Providers (APPs -  Physician Assistants and Nurse Practitioners) who all work together to provide you with the care you need, when you need it.  We recommend signing up for the patient portal called "MyChart".  Sign up information is provided on this After Visit Summary.  MyChart is used to connect with patients for Virtual Visits (Telemedicine).  Patients are able to view lab/test results, encounter notes, upcoming appointments, etc.  Non-urgent messages can be sent to your provider as well.   To learn more about what you can do with MyChart, go to [email or web address].    Your next appointment:   As needed  Provider:   Yates Decamp, MD     Other Instructions

## 2023-04-17 ENCOUNTER — Telehealth: Payer: Self-pay | Admitting: Cardiology

## 2023-04-17 NOTE — Telephone Encounter (Signed)
Pt c/o medication issue:  1. Name of Medication:   losartan-hydrochlorothiazide (HYZAAR) 50-12.5 MG tablet    2. How are you currently taking this medication (dosage and times per day)?  Take 1 tablet by mouth every morning.       3. Are you having a reaction (difficulty breathing--STAT)? No  4. What is your medication issue? Pt stated since the middle of December her eyes have been very red but her left one has been a little sore. Her vision doesn't seem to be that clear anymore. She'd like to discuss further with nurse. Please advise

## 2023-04-17 NOTE — Telephone Encounter (Signed)
Reviewed with Malena Peer, PharmD and OK for patient to continue medication at this time and see eye doctor as planned

## 2023-04-17 NOTE — Telephone Encounter (Signed)
I spoke with patient.  She reports since December her eyes have been red.  Appear "glassy" at times.  Will sometimes feel pressure in left eye. Has been using eye drops.  No previous eye problems.  Patient saw Dr Jacinto Halim in December and hydrochlorothiazide was changed to losartan hydrochlorothiazide. She has appointment with eye doctor next week.  I advised patient to see eye doctor as planned and to continue current medication at this time. Will check with PharmD regarding side effects

## 2023-05-06 ENCOUNTER — Other Ambulatory Visit: Payer: Self-pay | Admitting: Cardiology

## 2023-05-06 DIAGNOSIS — I10 Essential (primary) hypertension: Secondary | ICD-10-CM
